# Patient Record
Sex: Male | Born: 1995 | Race: White | Hispanic: No | Marital: Single | Smoking: Never smoker
Health system: Southern US, Community
[De-identification: ages and names within clinical notes are randomized; demographics above are authoritative.]

## PROBLEM LIST (undated history)

## (undated) DIAGNOSIS — F909 Attention-deficit hyperactivity disorder, unspecified type: Secondary | ICD-10-CM

## (undated) DIAGNOSIS — F419 Anxiety disorder, unspecified: Secondary | ICD-10-CM

---

## 2018-11-17 HISTORY — PX: PATELLA FRACTURE SURGERY: SHX735

## 2019-09-10 ENCOUNTER — Emergency Department (HOSPITAL_COMMUNITY)
Admission: EM | Admit: 2019-09-10 | Discharge: 2019-09-10 | Disposition: A | Discharge status: Home / Self Care | Payer: BC Managed Care – PPO | Attending: Emergency Medicine | Admitting: Emergency Medicine

## 2019-09-10 ENCOUNTER — Encounter (HOSPITAL_COMMUNITY): Payer: Self-pay

## 2019-09-10 ENCOUNTER — Other Ambulatory Visit: Payer: Self-pay

## 2019-09-10 ENCOUNTER — Emergency Department (HOSPITAL_COMMUNITY): Payer: BC Managed Care – PPO

## 2019-09-10 DIAGNOSIS — Y92410 Unspecified street and highway as the place of occurrence of the external cause: Secondary | ICD-10-CM | POA: Insufficient documentation

## 2019-09-10 DIAGNOSIS — S52514A Nondisplaced fracture of right radial styloid process, initial encounter for closed fracture: Secondary | ICD-10-CM | POA: Insufficient documentation

## 2019-09-10 DIAGNOSIS — Y93I9 Activity, other involving external motion: Secondary | ICD-10-CM | POA: Insufficient documentation

## 2019-09-10 DIAGNOSIS — S82892A Other fracture of left lower leg, initial encounter for closed fracture: Secondary | ICD-10-CM

## 2019-09-10 DIAGNOSIS — Y999 Unspecified external cause status: Secondary | ICD-10-CM | POA: Diagnosis not present

## 2019-09-10 DIAGNOSIS — S6991XA Unspecified injury of right wrist, hand and finger(s), initial encounter: Secondary | ICD-10-CM | POA: Diagnosis present
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
## 2019-09-10 NOTE — ED Notes (Signed)
ED Provider at bedside. 

## 2019-09-10 NOTE — ED Notes (Signed)
Patient transported to X-ray 
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
## 2022-12-27 ENCOUNTER — Emergency Department (HOSPITAL_COMMUNITY)
Admission: EM | Admit: 2022-12-27 | Discharge: 2022-12-27 | Disposition: A | Discharge status: Home / Self Care | Payer: Self-pay | Attending: Emergency Medicine | Admitting: Emergency Medicine

## 2022-12-27 ENCOUNTER — Emergency Department (HOSPITAL_COMMUNITY): Payer: Self-pay

## 2022-12-27 ENCOUNTER — Emergency Department (HOSPITAL_COMMUNITY): Payer: BC Managed Care – PPO

## 2022-12-27 ENCOUNTER — Encounter (HOSPITAL_COMMUNITY): Payer: Self-pay

## 2022-12-27 DIAGNOSIS — Y92410 Unspecified street and highway as the place of occurrence of the external cause: Secondary | ICD-10-CM | POA: Diagnosis not present

## 2022-12-27 DIAGNOSIS — S0990XA Unspecified injury of head, initial encounter: Secondary | ICD-10-CM | POA: Diagnosis not present

## 2022-12-27 DIAGNOSIS — S90511A Abrasion, right ankle, initial encounter: Secondary | ICD-10-CM | POA: Diagnosis not present

## 2022-12-27 DIAGNOSIS — S99911A Unspecified injury of right ankle, initial encounter: Secondary | ICD-10-CM | POA: Diagnosis present

## 2022-12-27 DIAGNOSIS — T148XXA Other injury of unspecified body region, initial encounter: Secondary | ICD-10-CM

## 2022-12-27 MED ORDER — ONDANSETRON 8 MG PO TBDP
8.0000 mg | ORAL_TABLET | Freq: Once | ORAL | Status: AC
  Administered 2022-12-27: 8 mg via ORAL
  Filled 2022-12-27: qty 1

## 2022-12-27 MED ORDER — CEFAZOLIN SODIUM-DEXTROSE 2-4 GM/100ML-% IV SOLN
2.0000 g | Freq: Once | INTRAVENOUS | Status: AC
  Administered 2022-12-27: 2 g via INTRAVENOUS
  Filled 2022-12-27 (×2): qty 100

## 2022-12-27 MED ORDER — HYDROMORPHONE HCL 1 MG/ML IJ SOLN
1.0000 mg | Freq: Once | INTRAMUSCULAR | Status: AC
  Administered 2022-12-27: 1 mg via INTRAVENOUS
  Filled 2022-12-27: qty 1

## 2022-12-27 MED ORDER — OXYCODONE-ACETAMINOPHEN 5-325 MG PO TABS: 1.0000 | ORAL_TABLET | Freq: Four times a day (QID) | ORAL | 0 refills | Status: DC | PRN

## 2022-12-27 MED ORDER — IBUPROFEN 800 MG PO TABS
800.0000 mg | ORAL_TABLET | Freq: Once | ORAL | Status: AC
  Administered 2022-12-27: 800 mg via ORAL
  Filled 2022-12-27: qty 1

## 2022-12-27 MED ORDER — CEPHALEXIN 500 MG PO CAPS: 500.0000 mg | ORAL_CAPSULE | Freq: Four times a day (QID) | ORAL | 0 refills | Status: AC
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
## 2022-12-29 ENCOUNTER — Ambulatory Visit (INDEPENDENT_AMBULATORY_CARE_PROVIDER_SITE_OTHER): Payer: Self-pay | Admitting: Orthopedic Surgery

## 2022-12-29 ENCOUNTER — Telehealth: Payer: Self-pay

## 2022-12-29 ENCOUNTER — Encounter: Payer: Self-pay | Admitting: Orthopedic Surgery

## 2022-12-29 DIAGNOSIS — S91031A Puncture wound without foreign body, right ankle, initial encounter: Secondary | ICD-10-CM

## 2022-12-29 NOTE — Telephone Encounter (Signed)
I called and lm on vm to advise that Dr. Sharol Given would like to see him in the office today. Advised to call back so that we can set up appt. I will hold this message and try pt again later on today. Right ankle injury.
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
## 2022-12-29 NOTE — Telephone Encounter (Signed)
Appt today at 1:45

## 2022-12-30 ENCOUNTER — Encounter (HOSPITAL_COMMUNITY): Payer: Self-pay | Admitting: Orthopedic Surgery

## 2022-12-30 NOTE — Progress Notes (Signed)
Carlos Ruiz asked me to speak to his wife, Carlos Ruiz, so I did.  Carlos Ruiz denies having any s/s of Covid in her household, also denies any known exposure to Covid.  Carlos Ruiz does not have a PCP. Carlos Ruiz is taking 800 mg of Ibuprofen  for pain, occasionally Percocet or extra strength tylenol. I instructed Carlos Ruiz to stop the Ibuprofen, Carlos Ruiz my take extra strength Tylenol and Percocet; I reminded Carlos Ruiz to not exceed 4000 mg of Tylenol in 24 hours.

## 2022-12-31 ENCOUNTER — Other Ambulatory Visit: Payer: Self-pay

## 2022-12-31 ENCOUNTER — Ambulatory Visit (HOSPITAL_BASED_OUTPATIENT_CLINIC_OR_DEPARTMENT_OTHER): Payer: Self-pay | Admitting: Anesthesiology

## 2022-12-31 ENCOUNTER — Encounter (HOSPITAL_COMMUNITY)
Admission: RE | Disposition: A | Discharge status: Home / Self Care | Payer: Self-pay | Source: Home / Self Care | Attending: Orthopedic Surgery

## 2022-12-31 ENCOUNTER — Encounter (HOSPITAL_COMMUNITY): Payer: Self-pay | Admitting: Orthopedic Surgery

## 2022-12-31 ENCOUNTER — Ambulatory Visit (HOSPITAL_COMMUNITY)
Admission: RE | Admit: 2022-12-31 | Discharge: 2022-12-31 | Disposition: A | Discharge status: Home / Self Care | Payer: Self-pay | Attending: Orthopedic Surgery | Admitting: Orthopedic Surgery

## 2022-12-31 ENCOUNTER — Ambulatory Visit (HOSPITAL_COMMUNITY): Payer: Self-pay | Admitting: Anesthesiology

## 2022-12-31 DIAGNOSIS — M86161 Other acute osteomyelitis, right tibia and fibula: Secondary | ICD-10-CM

## 2022-12-31 DIAGNOSIS — S91031A Puncture wound without foreign body, right ankle, initial encounter: Secondary | ICD-10-CM | POA: Diagnosis present

## 2022-12-31 DIAGNOSIS — F419 Anxiety disorder, unspecified: Secondary | ICD-10-CM

## 2022-12-31 DIAGNOSIS — M869 Osteomyelitis, unspecified: Secondary | ICD-10-CM

## 2022-12-31 DIAGNOSIS — S91001A Unspecified open wound, right ankle, initial encounter: Secondary | ICD-10-CM

## 2022-12-31 HISTORY — DX: Anxiety disorder, unspecified: F41.9

## 2022-12-31 HISTORY — DX: Attention-deficit hyperactivity disorder, unspecified type: F90.9

## 2022-12-31 HISTORY — PX: I & D EXTREMITY: SHX5045

## 2022-12-31 HISTORY — PX: APPLICATION OF WOUND VAC: SHX5189

## 2022-12-31 LAB — CBC
HCT: 38.3 % — ABNORMAL LOW (ref 39.0–52.0)
Hemoglobin: 13.8 g/dL (ref 13.0–17.0)
MCH: 30.3 pg (ref 26.0–34.0)
MCHC: 36 g/dL (ref 30.0–36.0)
MCV: 84.2 fL (ref 80.0–100.0)
Platelets: 219 10*3/uL (ref 150–400)
RBC: 4.55 MIL/uL (ref 4.22–5.81)
RDW: 12 % (ref 11.5–15.5)
WBC: 8.6 10*3/uL (ref 4.0–10.5)
nRBC: 0 % (ref 0.0–0.2)

## 2022-12-31 LAB — SURGICAL PCR SCREEN
MRSA, PCR: NEGATIVE
Staphylococcus aureus: NEGATIVE

## 2022-12-31 SURGERY — IRRIGATION AND DEBRIDEMENT EXTREMITY
Anesthesia: Monitor Anesthesia Care | Laterality: Right

## 2022-12-31 MED ORDER — ONDANSETRON HCL 4 MG/2ML IJ SOLN
INTRAMUSCULAR | Status: DC | PRN
  Administered 2022-12-31: 4 mg via INTRAVENOUS

## 2022-12-31 MED ORDER — ORAL CARE MOUTH RINSE: 15.0000 mL | Freq: Once | OROMUCOSAL | Status: AC

## 2022-12-31 MED ORDER — ROPIVACAINE HCL 5 MG/ML IJ SOLN
INTRAMUSCULAR | Status: DC | PRN
  Administered 2022-12-31: 40 mL via PERINEURAL

## 2022-12-31 MED ORDER — DEXAMETHASONE SODIUM PHOSPHATE 10 MG/ML IJ SOLN
INTRAMUSCULAR | Status: DC | PRN
  Administered 2022-12-31: 10 mg

## 2022-12-31 MED ORDER — FENTANYL CITRATE (PF) 100 MCG/2ML IJ SOLN
INTRAMUSCULAR | Status: AC
  Administered 2022-12-31: 100 ug via INTRAVENOUS
  Filled 2022-12-31: qty 2

## 2022-12-31 MED ORDER — KETOROLAC TROMETHAMINE 30 MG/ML IJ SOLN
INTRAMUSCULAR | Status: DC | PRN
  Administered 2022-12-31: 30 mg via INTRAVENOUS

## 2022-12-31 MED ORDER — ACETAMINOPHEN 500 MG PO TABS: 1000.0000 mg | ORAL_TABLET | Freq: Once | ORAL | Status: DC

## 2022-12-31 MED ORDER — CEFAZOLIN SODIUM-DEXTROSE 2-3 GM-%(50ML) IV SOLR
INTRAVENOUS | Status: DC | PRN
  Administered 2022-12-31: 2 g via INTRAVENOUS

## 2022-12-31 MED ORDER — FENTANYL CITRATE (PF) 100 MCG/2ML IJ SOLN: 100.0000 ug | Freq: Once | INTRAMUSCULAR | Status: AC

## 2022-12-31 MED ORDER — MIDAZOLAM HCL 2 MG/2ML IJ SOLN
INTRAMUSCULAR | Status: AC
  Administered 2022-12-31: 2 mg via INTRAVENOUS
  Filled 2022-12-31: qty 2

## 2022-12-31 MED ORDER — VANCOMYCIN HCL 500 MG IV SOLR
INTRAVENOUS | Status: DC | PRN
  Administered 2022-12-31: 500 mg via TOPICAL

## 2022-12-31 MED ORDER — 0.9 % SODIUM CHLORIDE (POUR BTL) OPTIME
TOPICAL | Status: DC | PRN
  Administered 2022-12-31: 1000 mL

## 2022-12-31 MED ORDER — OXYCODONE-ACETAMINOPHEN 5-325 MG PO TABS: 1.0000 | ORAL_TABLET | ORAL | 0 refills | Status: AC | PRN

## 2022-12-31 MED ORDER — CHLORHEXIDINE GLUCONATE 0.12 % MT SOLN
15.0000 mL | Freq: Once | OROMUCOSAL | Status: AC
  Administered 2022-12-31: 15 mL via OROMUCOSAL
  Filled 2022-12-31: qty 15

## 2022-12-31 MED ORDER — MIDAZOLAM HCL 2 MG/2ML IJ SOLN: 2.0000 mg | Freq: Once | INTRAMUSCULAR | Status: AC

## 2022-12-31 MED ORDER — ACETAMINOPHEN 500 MG PO TABS
1000.0000 mg | ORAL_TABLET | Freq: Once | ORAL | Status: AC
  Administered 2022-12-31: 1000 mg via ORAL
  Filled 2022-12-31: qty 2

## 2022-12-31 MED ORDER — PROPOFOL 10 MG/ML IV BOLUS
INTRAVENOUS | Status: DC | PRN
  Administered 2022-12-31 (×6): 20 mg via INTRAVENOUS
  Administered 2022-12-31: 30 mg via INTRAVENOUS
  Administered 2022-12-31: 10 mg via INTRAVENOUS

## 2022-12-31 MED ORDER — VANCOMYCIN HCL 500 MG IV SOLR
INTRAVENOUS | Status: AC
  Filled 2022-12-31: qty 10

## 2022-12-31 MED ORDER — LACTATED RINGERS IV SOLN: INTRAVENOUS | Status: DC

## 2022-12-31 SURGICAL SUPPLY — 40 items
BAG COUNTER SPONGE SURGICOUNT (BAG) IMPLANT
BAG SPNG CNTER NS LX DISP (BAG)
BLADE LONG MED 31X9 (MISCELLANEOUS) IMPLANT
BLADE SURG 21 STRL SS (BLADE) ×1 IMPLANT
BNDG COHESIVE 6X5 TAN STRL LF (GAUZE/BANDAGES/DRESSINGS) IMPLANT
BNDG GAUZE DERMACEA FLUFF 4 (GAUZE/BANDAGES/DRESSINGS) ×2 IMPLANT
BNDG GZE DERMACEA 4 6PLY (GAUZE/BANDAGES/DRESSINGS)
COVER SURGICAL LIGHT HANDLE (MISCELLANEOUS) ×2 IMPLANT
DRAPE INCISE IOBAN 66X45 STRL (DRAPES) IMPLANT
DRAPE U-SHAPE 47X51 STRL (DRAPES) ×1 IMPLANT
DRESSING VERAFLO CLEANS CC MED (GAUZE/BANDAGES/DRESSINGS) IMPLANT
DRSG ADAPTIC 3X8 NADH LF (GAUZE/BANDAGES/DRESSINGS) ×1 IMPLANT
DRSG VERAFLO CLEANSE CC MED (GAUZE/BANDAGES/DRESSINGS) ×1
DURAPREP 26ML APPLICATOR (WOUND CARE) ×1 IMPLANT
ELECT REM PT RETURN 9FT ADLT (ELECTROSURGICAL)
ELECTRODE REM PT RTRN 9FT ADLT (ELECTROSURGICAL) IMPLANT
GAUZE SPONGE 4X4 12PLY STRL (GAUZE/BANDAGES/DRESSINGS) ×1 IMPLANT
GLOVE BIOGEL PI IND STRL 9 (GLOVE) ×1 IMPLANT
GLOVE SURG ORTHO 9.0 STRL STRW (GLOVE) ×1 IMPLANT
GOWN STRL REUS W/ TWL XL LVL3 (GOWN DISPOSABLE) ×2 IMPLANT
GOWN STRL REUS W/TWL XL LVL3 (GOWN DISPOSABLE) ×2
GRAFT SKIN MARIGEN MICRO 38 (Tissue) IMPLANT
GRAFT SKIN WND OMEGA3 SB 7X10 (Tissue) IMPLANT
HANDPIECE INTERPULSE COAX TIP (DISPOSABLE)
KIT BASIN OR (CUSTOM PROCEDURE TRAY) ×1 IMPLANT
KIT DRSG PREVENA PLUS 7DAY 125 (MISCELLANEOUS) IMPLANT
KIT TURNOVER KIT B (KITS) ×1 IMPLANT
MANIFOLD NEPTUNE II (INSTRUMENTS) ×1 IMPLANT
NS IRRIG 1000ML POUR BTL (IV SOLUTION) ×1 IMPLANT
PACK ORTHO EXTREMITY (CUSTOM PROCEDURE TRAY) ×1 IMPLANT
PAD ARMBOARD 7.5X6 YLW CONV (MISCELLANEOUS) ×2 IMPLANT
PAD NEG PRESSURE SENSATRAC (MISCELLANEOUS) IMPLANT
SET HNDPC FAN SPRY TIP SCT (DISPOSABLE) IMPLANT
STOCKINETTE IMPERVIOUS 9X36 MD (GAUZE/BANDAGES/DRESSINGS) IMPLANT
SUT ETHILON 2 0 PSLX (SUTURE) ×1 IMPLANT
SWAB COLLECTION DEVICE MRSA (MISCELLANEOUS) ×1 IMPLANT
SWAB CULTURE ESWAB REG 1ML (MISCELLANEOUS) IMPLANT
TOWEL GREEN STERILE (TOWEL DISPOSABLE) ×1 IMPLANT
TUBE CONNECTING 12X1/4 (SUCTIONS) ×1 IMPLANT
YANKAUER SUCT BULB TIP NO VENT (SUCTIONS) ×1 IMPLANT

## 2022-12-31 NOTE — Anesthesia Procedure Notes (Signed)
Anesthesia Regional Block: Adductor canal block   Pre-Anesthetic Checklist: , timeout performed,  Correct Patient, Correct Site, Correct Laterality,  Correct Procedure, Correct Position, site marked,  Risks and benefits discussed,  Surgical consent,  Pre-op evaluation,  At surgeon's request and post-op pain management  Laterality: Right  Prep: Maximum Sterile Barrier Precautions used, chloraprep       Needles:  Injection technique: Single-shot  Needle Type: Echogenic Stimulator Needle     Needle Length: 9cm  Needle Gauge: 22     Additional Needles:   Procedures:,,,, ultrasound used (permanent image in chart),,    Narrative:  Start time: 12/31/2022 9:40 AM End time: 12/31/2022 9:45 AM Injection made incrementally with aspirations every 5 mL.  Performed by: Personally  Anesthesiologist: Pervis Hocking, DO  Additional Notes: Monitors applied. No increased pain on injection. No increased resistance to injection. Injection made in 5cc increments. Good needle visualization. Patient tolerated procedure well.
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
## 2022-12-31 NOTE — Anesthesia Postprocedure Evaluation (Signed)
Anesthesia Post Note  Patient: Carlos Ruiz  Procedure(s) Performed: RIGHT ANKLE DEBRIDEMENT AND APPLY TISSUE GRAFT (Right) APPLICATION OF WOUND VAC (Right)     Patient location during evaluation: PACU Anesthesia Type: Regional and MAC Level of consciousness: awake and alert Pain management: pain level controlled Vital Signs Assessment: post-procedure vital signs reviewed and stable Respiratory status: spontaneous breathing, nonlabored ventilation and respiratory function stable Cardiovascular status: blood pressure returned to baseline and stable Postop Assessment: no apparent nausea or vomiting Anesthetic complications: no   No notable events documented.  Last Vitals:  Vitals:   12/31/22 1115 12/31/22 1130  BP: (!) 105/58 106/64  Pulse: (!) 59 71  Resp: 12 15  Temp:  36.4 C  SpO2: 98% 100%    Last Pain:  Vitals:   12/31/22 1115  TempSrc:   PainSc: 0-No pain                 Pervis Hocking

## 2022-12-31 NOTE — Transfer of Care (Signed)
Immediate Anesthesia Transfer of Care Note  Patient: Bjarne Freda  Procedure(s) Performed: RIGHT ANKLE DEBRIDEMENT AND APPLY TISSUE GRAFT (Right) APPLICATION OF WOUND VAC (Right)  Patient Location: PACU  Anesthesia Type:MAC combined with regional for post-op pain  Level of Consciousness: awake and patient cooperative  Airway & Oxygen Therapy: Patient Spontanous Breathing  Post-op Assessment: Report given to RN and Post -op Vital signs reviewed and stable  Post vital signs: Reviewed and stable  Last Vitals:  Vitals Value Taken Time  BP    Temp    Pulse 78 12/31/22 1052  Resp 15 12/31/22 1052  SpO2 100 % 12/31/22 1052  Vitals shown include unvalidated device data.  Last Pain:  Vitals:   12/31/22 0948  TempSrc:   PainSc: 0-No pain      Patients Stated Pain Goal: 0 ([id number])  Complications: No notable events documented.

## 2022-12-31 NOTE — Anesthesia Preprocedure Evaluation (Signed)
Anesthesia Evaluation  Patient identified by MRN, date of birth, ID band Patient awake    Reviewed: Allergy & Precautions, H&P , NPO status , Patient's Chart, lab work & pertinent test results  Airway Mallampati: II  TM Distance: >3 FB Neck ROM: Full    Dental no notable dental hx.    Pulmonary neg pulmonary ROS   Pulmonary exam normal breath sounds clear to auscultation       Cardiovascular negative cardio ROS Normal cardiovascular exam Rhythm:Regular Rate:Normal     Neuro/Psych  PSYCHIATRIC DISORDERS Anxiety     negative neurological ROS     GI/Hepatic negative GI ROS, Neg liver ROS,,,  Endo/Other  negative endocrine ROS    Renal/GU negative Renal ROS  negative genitourinary   Musculoskeletal R ankle traumatic wound   Abdominal   Peds negative pediatric ROS (+)  Hematology negative hematology ROS (+)   Anesthesia Other Findings   Reproductive/Obstetrics negative OB ROS                             Anesthesia Physical Anesthesia Plan  ASA: 1  Anesthesia Plan: Regional and MAC   Post-op Pain Management: Tylenol PO (pre-op)*, Regional block* and Toradol IV (intra-op)*   Induction: Intravenous  PONV Risk Score and Plan: 1 and Ondansetron, Dexamethasone, Midazolam and Treatment may vary due to age or medical condition  Airway Management Planned: Natural Airway and Simple Face Mask  Additional Equipment: None  Intra-op Plan:   Post-operative Plan:   Informed Consent: I have reviewed the patients History and Physical, chart, labs and discussed the procedure including the risks, benefits and alternatives for the proposed anesthesia with the patient or authorized representative who has indicated his/her understanding and acceptance.     Dental advisory given  Plan Discussed with: CRNA  Anesthesia Plan Comments:        Anesthesia Quick Evaluation

## 2022-12-31 NOTE — Progress Notes (Signed)
Orthopedic Tech Progress Note Patient Details:  Carlos Ruiz [birth year] [id number]  Ortho Devices Type of Ortho Device: CAM walker, Crutches Ortho Device/Splint Location: RLE Ortho Device/Splint Interventions: Application, Adjustment   Post Interventions Patient Tolerated: Well  Carlos Ruiz Carlos Ruiz 12/31/2022, 11:51 AM
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
## 2022-12-31 NOTE — Interval H&P Note (Signed)
History and Physical Interval Note:  12/31/2022 7:44 AM  Carlos Ruiz  has presented today for surgery, with the diagnosis of Traumatic Wound Right Ankle.  The various methods of treatment have been discussed with the patient and family. After consideration of risks, benefits and other options for treatment, the patient has consented to  Procedure(s): RIGHT ANKLE DEBRIDEMENT AND APPLY TISSUE GRAFT (Right) as a surgical intervention.  The patient's history has been reviewed, patient examined, no change in status, stable for surgery.  I have reviewed the patient's chart and labs.  Questions were answered to the patient's satisfaction.     Newt Minion

## 2022-12-31 NOTE — Anesthesia Procedure Notes (Signed)
Anesthesia Regional Block: Popliteal block   Pre-Anesthetic Checklist: , timeout performed,  Correct Patient, Correct Site, Correct Laterality,  Correct Procedure, Correct Position, site marked,  Risks and benefits discussed,  Surgical consent,  Pre-op evaluation,  At surgeon's request and post-op pain management  Laterality: Right  Prep: Maximum Sterile Barrier Precautions used, chloraprep       Needles:  Injection technique: Single-shot  Needle Type: Echogenic Stimulator Needle     Needle Length: 9cm  Needle Gauge: 22     Additional Needles:   Procedures:,,,, ultrasound used (permanent image in chart),,    Narrative:  Start time: 12/31/2022 9:35 AM End time: 12/31/2022 9:40 AM Injection made incrementally with aspirations every 5 mL.  Performed by: Personally  Anesthesiologist: Pervis Hocking, DO  Additional Notes: Monitors applied. No increased pain on injection. No increased resistance to injection. Injection made in 5cc increments. Good needle visualization. Patient tolerated procedure well.

## 2023-01-01 ENCOUNTER — Encounter (HOSPITAL_COMMUNITY): Payer: Self-pay | Admitting: Orthopedic Surgery

## 2023-01-05 LAB — AEROBIC/ANAEROBIC CULTURE W GRAM STAIN (SURGICAL/DEEP WOUND): Gram Stain: NONE SEEN

## 2023-01-07 ENCOUNTER — Encounter: Payer: Self-pay | Admitting: Family

## 2023-01-07 ENCOUNTER — Ambulatory Visit (INDEPENDENT_AMBULATORY_CARE_PROVIDER_SITE_OTHER): Payer: Self-pay | Admitting: Family

## 2023-01-07 DIAGNOSIS — S91001A Unspecified open wound, right ankle, initial encounter: Secondary | ICD-10-CM

## 2023-01-07 DIAGNOSIS — Z9889 Other specified postprocedural states: Secondary | ICD-10-CM

## 2023-01-07 NOTE — Progress Notes (Signed)
   Post-Op Visit Note   Patient: Carlos Ruiz           Date of Birth: [birth year]           MRN: [id number] Visit Date: 01/07/2023 PCP: Patient, No Pcp Per  Chief Complaint:  Chief Complaint  Patient presents with   Right Ankle - Routine Post Op    12/31/2022 right ankle debridement and Kerecis graft     HPI:  HPI The patient is a 27 year old gentleman who presents status post right ankle debridement with Kerecis application wound VAC removed today.  The patient wondering if he may begin weightbearing. Ortho Exam On examination of the right dorsal lateral ankle there is a large wound with good uptake of the Kerecis graft.  There is no tunneling no purulence no surrounding erythema or odor  Visit Diagnoses: No diagnosis found.  Plan: Begin daily Dial soap cleansing dry dressings Ace wrap may touchdown weight-bear  Follow-Up Instructions: Return in about 1 week (around 01/14/2023).   Imaging: No results found.  Orders:  No orders of the defined types were placed in this encounter.  No orders of the defined types were placed in this encounter.    PMFS History: Patient Active Problem List   Diagnosis Date Noted   Ankle wound, right, initial encounter 12/31/2022   Pyogenic inflammation of bone ([place]) 12/31/2022   Past Medical History:  Diagnosis Date   ADHD (attention deficit hyperactivity disorder)    undiagnosed   Anxiety     History reviewed. No pertinent family history.  Past Surgical History:  Procedure Laterality Date   APPLICATION OF WOUND VAC Right 12/31/2022   Procedure: APPLICATION OF WOUND VAC;  Surgeon: Newt Minion, MD;  Location: [place];  Service: Orthopedics;  Laterality: Right;   I & D EXTREMITY Right 12/31/2022   Procedure: RIGHT ANKLE DEBRIDEMENT AND APPLY TISSUE GRAFT;  Surgeon: Newt Minion, MD;  Location: [place];  Service: Orthopedics;  Laterality: Right;   PATELLA FRACTURE SURGERY Right 2020   Social History   Occupational History   Not  on file  Tobacco Use   Smoking status: Never   Smokeless tobacco: Never  Vaping Use   Vaping Use: Never used  Substance and Sexual Activity   Alcohol use: Not Currently    Comment: wine or beer ocassional a week   Drug use: Yes    Frequency: 7.0 times per week    Types: Marijuana    Comment: Last time - 12/29/22   Sexual activity: Not on file

## 2023-01-14 ENCOUNTER — Encounter: Payer: Self-pay | Admitting: Family

## 2023-01-14 ENCOUNTER — Ambulatory Visit (INDEPENDENT_AMBULATORY_CARE_PROVIDER_SITE_OTHER): Payer: Self-pay | Admitting: Family

## 2023-01-14 DIAGNOSIS — S91001A Unspecified open wound, right ankle, initial encounter: Secondary | ICD-10-CM

## 2023-01-14 NOTE — Progress Notes (Signed)
   Post-Op Visit Note   Patient: Carlos Ruiz           Date of Birth: 1996-09-22           MRN: [id number] Visit Date: 01/14/2023 PCP: Patient, No Pcp Per  Chief Complaint: No chief complaint on file.   HPI:  HPI The patient is a 27 year old gentleman seen status post right ankle debridement with Kerecis graft placement on February 14 he has been doing dry dressing changes for the last 1 week does report some pinkish-yellow drainage.  No odor Ortho Exam Please see attached image. Overall the ulcer is healing quite well with good uptake of the graft material over the area of the malleolus laterally there is a half dollar sized area filled in with fibrinous tissue this has 3 mm of depth there is no exposed bone.  The remainder of the ulcer is filled with granulation epithelization is showing marked improvement. The malleolar aspect was debrided today and donated Kerecis sheet graft material was applied.  Visit Diagnoses: No diagnosis found.  Plan: He will follow-up in 1 week.  He will leave the Adaptic dressing in place and change the overlying gauze beginning in 3 days.  Follow-Up Instructions: No follow-ups on file.   Imaging: No results found.  Orders:  No orders of the defined types were placed in this encounter.  No orders of the defined types were placed in this encounter.    PMFS History: Patient Active Problem List   Diagnosis Date Noted   Ankle wound, right, initial encounter 12/31/2022   Pyogenic inflammation of bone ([place]) 12/31/2022   Past Medical History:  Diagnosis Date   ADHD (attention deficit hyperactivity disorder)    undiagnosed   Anxiety     History reviewed. No pertinent family history.  Past Surgical History:  Procedure Laterality Date   APPLICATION OF WOUND VAC Right 12/31/2022   Procedure: APPLICATION OF WOUND VAC;  Surgeon: Newt Minion, MD;  Location: [place];  Service: Orthopedics;  Laterality: Right;   I & D EXTREMITY Right 12/31/2022    Procedure: RIGHT ANKLE DEBRIDEMENT AND APPLY TISSUE GRAFT;  Surgeon: Newt Minion, MD;  Location: [place];  Service: Orthopedics;  Laterality: Right;   PATELLA FRACTURE SURGERY Right 2020   Social History   Occupational History   Not on file  Tobacco Use   Smoking status: Never   Smokeless tobacco: Never  Vaping Use   Vaping Use: Never used  Substance and Sexual Activity   Alcohol use: Not Currently    Comment: wine or beer ocassional a week   Drug use: Yes    Frequency: 7.0 times per week    Types: Marijuana    Comment: Last time - 12/29/22   Sexual activity: Not on file

## 2023-01-21 ENCOUNTER — Ambulatory Visit (INDEPENDENT_AMBULATORY_CARE_PROVIDER_SITE_OTHER): Payer: Self-pay | Admitting: Family

## 2023-01-21 ENCOUNTER — Encounter: Payer: Self-pay | Admitting: Family

## 2023-01-21 DIAGNOSIS — S91001A Unspecified open wound, right ankle, initial encounter: Secondary | ICD-10-CM

## 2023-01-21 NOTE — Progress Notes (Signed)
   Post-Op Visit Note   Patient: Carlos Ruiz           Date of Birth: Jul 20, 1996           MRN: [id number] Visit Date: 01/21/2023 PCP: Patient, No Pcp Per  Chief Complaint:  Chief Complaint  Patient presents with   Right Ankle - Routine Post Op    12/31/2022 right debridement and kerecis graft     HPI:  HPI The patient is a 27 year old gentleman who is seen status post right ankle debridement with Kerecis graft placement.  Donated in office graft was placed 1 week ago. Ortho Exam On examination of the right ankle there is no edema of the wound has healed two thirds of the original wound area.  Over the lateral malleolus there continues to be half dollar sized open area with 3 mm of depth there is fibrinous tissue this was debrided with Debrisoft dressing.   There is no drainage surrounding erythema or sign of infection   Visit Diagnoses: No diagnosis found.  Plan: Donated graft material applied over the malleolar area of the wound he will follow-up in the office in 1 week discussed possibility of repeat irrigation debridement and OR  Follow-Up Instructions: No follow-ups on file.   Imaging: No results found.  Orders:  No orders of the defined types were placed in this encounter.  No orders of the defined types were placed in this encounter.    PMFS History: Patient Active Problem List   Diagnosis Date Noted   Ankle wound, right, initial encounter 12/31/2022   Pyogenic inflammation of bone ([place]) 12/31/2022   Past Medical History:  Diagnosis Date   ADHD (attention deficit hyperactivity disorder)    undiagnosed   Anxiety     No family history on file.  Past Surgical History:  Procedure Laterality Date   APPLICATION OF WOUND VAC Right 12/31/2022   Procedure: APPLICATION OF WOUND VAC;  Surgeon: Newt Minion, MD;  Location: [place];  Service: Orthopedics;  Laterality: Right;   I & D EXTREMITY Right 12/31/2022   Procedure: RIGHT ANKLE DEBRIDEMENT AND APPLY TISSUE  GRAFT;  Surgeon: Newt Minion, MD;  Location: [place];  Service: Orthopedics;  Laterality: Right;   PATELLA FRACTURE SURGERY Right 2020   Social History   Occupational History   Not on file  Tobacco Use   Smoking status: Never   Smokeless tobacco: Never  Vaping Use   Vaping Use: Never used  Substance and Sexual Activity   Alcohol use: Not Currently    Comment: wine or beer ocassional a week   Drug use: Yes    Frequency: 7.0 times per week    Types: Marijuana    Comment: Last time - 12/29/22   Sexual activity: Not on file

## 2023-01-23 ENCOUNTER — Encounter: Payer: Self-pay | Admitting: Family

## 2023-01-27 ENCOUNTER — Ambulatory Visit (INDEPENDENT_AMBULATORY_CARE_PROVIDER_SITE_OTHER): Payer: Self-pay | Admitting: Orthopedic Surgery

## 2023-01-27 DIAGNOSIS — S91001A Unspecified open wound, right ankle, initial encounter: Secondary | ICD-10-CM

## 2023-01-27 MED ORDER — SILVER SULFADIAZINE 1 % EX CREA: 1.0000 | TOPICAL_CREAM | Freq: Every day | CUTANEOUS | 3 refills | Status: AC

## 2023-02-09 ENCOUNTER — Encounter: Payer: Self-pay | Admitting: Orthopedic Surgery

## 2023-02-09 NOTE — Progress Notes (Signed)
Office Visit Note   Patient: Carlos Ruiz           Date of Birth: May 11, 1996           MRN: [id number] Visit Date: 01/27/2023              Requested by: No referring provider defined for this encounter. PCP: Patient, No Pcp Per  Chief Complaint  Patient presents with   Right Ankle - Routine Post Op    12/31/2022 right debridement and kerecis graft      HPI: Patient is a 27 year old male who is status post right ankle debridement and application of Kerecis tissue graft on February 14.  Donated Kerecis tissue graft was applied on March 6.  Assessment & Plan: Visit Diagnoses:  1. Ankle wound, right, initial encounter     Plan: Will continue with Dial soap cleansing Silvadene dressing changes a prescription for Silvadene is sent.  Follow-Up Instructions: Return in about 4 weeks (around 02/24/2023).   Ortho Exam  Patient is alert, oriented, no adenopathy, well-dressed, normal affect, normal respiratory effort. Examination the wound has improved granulation tissue decreased size the wound measures 3 x 5 cm.  Imaging: No results found.   Labs: Lab Results  Component Value Date   REPTSTATUS 01/05/2023 FINAL 12/31/2022   GRAMSTAIN NO WBC SEEN NO ORGANISMS SEEN  12/31/2022   CULT  12/31/2022    ABUNDANT ACINETOBACTER CALCOACETICUS/BAUMANNII COMPLEX DR. Shane Badeaux NOTIFIED OF CULTURE GROWTH 01/02/23 BY D. VANHOOK ABUNDANT BACILLUS SPECIES Standardized susceptibility testing for this organism is not available. NO ANAEROBES ISOLATED Performed at [facility], [place] [address], [place], [place] [address]    LABORGA ACINETOBACTER CALCOACETICUS/BAUMANNII COMPLEX 12/31/2022     No results found for: "ALBUMIN", "PREALBUMIN", "CBC"  No results found for: "MG" No results found for: "VD25OH"  No results found for: "PREALBUMIN"    Latest Ref Rng & Units 12/31/2022    8:35 AM  CBC EXTENDED  WBC 4.0 - 10.5 K/uL 8.6   RBC 4.22 - 5.81 MIL/uL 4.55   Hemoglobin 13.0 - 17.0  g/dL 13.8   HCT 39.0 - 52.0 % 38.3   Platelets 150 - 400 K/uL 219      There is no height or weight on file to calculate BMI.  Orders:  No orders of the defined types were placed in this encounter.  Meds ordered this encounter  Medications   silver sulfADIAZINE (SILVADENE) 1 % cream    Sig: Apply 1 Application topically daily. Apply to affected area daily plus dry dressing    Dispense:  400 g    Refill:  3     Procedures: No procedures performed  Clinical Data: No additional findings.  ROS:  All other systems negative, except as noted in the HPI. Review of Systems  Objective: Vital Signs: There were no vitals taken for this visit.  Specialty Comments:  No specialty comments available.  PMFS History: Patient Active Problem List   Diagnosis Date Noted   Ankle wound, right, initial encounter 12/31/2022   Pyogenic inflammation of bone ([place]) 12/31/2022   Past Medical History:  Diagnosis Date   ADHD (attention deficit hyperactivity disorder)    undiagnosed   Anxiety     History reviewed. No pertinent family history.  Past Surgical History:  Procedure Laterality Date   APPLICATION OF WOUND VAC Right 12/31/2022   Procedure: APPLICATION OF WOUND VAC;  Surgeon: Newt Minion, MD;  Location: [place];  Service: Orthopedics;  Laterality: Right;  I & D EXTREMITY Right 12/31/2022   Procedure: RIGHT ANKLE DEBRIDEMENT AND APPLY TISSUE GRAFT;  Surgeon: Newt Minion, MD;  Location: [place];  Service: Orthopedics;  Laterality: Right;   PATELLA FRACTURE SURGERY Right 2020   Social History   Occupational History   Not on file  Tobacco Use   Smoking status: Never   Smokeless tobacco: Never  Vaping Use   Vaping Use: Never used  Substance and Sexual Activity   Alcohol use: Not Currently    Comment: wine or beer ocassional a week   Drug use: Yes    Frequency: 7.0 times per week    Types: Marijuana    Comment: Last time - 12/29/22   Sexual activity: Not on file

## 2023-02-24 ENCOUNTER — Ambulatory Visit (INDEPENDENT_AMBULATORY_CARE_PROVIDER_SITE_OTHER): Payer: Self-pay | Admitting: Orthopedic Surgery

## 2023-02-24 DIAGNOSIS — S91001A Unspecified open wound, right ankle, initial encounter: Secondary | ICD-10-CM

## 2023-02-24 DIAGNOSIS — Z9889 Other specified postprocedural states: Secondary | ICD-10-CM

## 2023-02-24 DIAGNOSIS — S91031A Puncture wound without foreign body, right ankle, initial encounter: Secondary | ICD-10-CM

## 2023-03-08 ENCOUNTER — Encounter: Payer: Self-pay | Admitting: Orthopedic Surgery

## 2023-03-08 NOTE — Progress Notes (Signed)
Office Visit Note   Patient: Carlos Ruiz           Date of Birth: 02-May-1996           MRN: [id number] Visit Date: 02/24/2023              Requested by: No referring provider defined for this encounter. PCP: Patient, No Pcp Per  Chief Complaint  Patient presents with   Right Ankle - Routine Post Op    12/31/2022 right debridement and kerecis graft      HPI: Patient is a 27 year old gentleman who is 2 months status post right ankle debridement and application of Kerecis and donated graft applied March 6.  Assessment & Plan: Visit Diagnoses:  1. Ankle wound, right, initial encounter   2. History of artificial skin graft   3. Penetrating ankle wound, right, initial encounter     Plan: Continue Dial soap cleansing dry dressing changes  Follow-Up Instructions: Return in about 4 weeks (around 03/24/2023).   Ortho Exam  Patient is alert, oriented, no adenopathy, well-dressed, normal affect, normal respiratory effort. Examination there has been significant healing of the large wound.  Currently measures 0.5 x 3 cm.  Imaging: No results found.   Labs: Lab Results  Component Value Date   REPTSTATUS 01/05/2023 FINAL 12/31/2022   GRAMSTAIN NO WBC SEEN NO ORGANISMS SEEN  12/31/2022   CULT  12/31/2022    ABUNDANT ACINETOBACTER CALCOACETICUS/BAUMANNII COMPLEX DR. Nayzeth Altman NOTIFIED OF CULTURE GROWTH 01/02/23 BY D. VANHOOK ABUNDANT BACILLUS SPECIES Standardized susceptibility testing for this organism is not available. NO ANAEROBES ISOLATED Performed at [facility], [address], [place], [place] [address]    LABORGA ACINETOBACTER CALCOACETICUS/BAUMANNII COMPLEX 12/31/2022     No results found for: "ALBUMIN", "PREALBUMIN", "CBC"  No results found for: "MG" No results found for: "VD25OH"  No results found for: "PREALBUMIN"    Latest Ref Rng & Units 12/31/2022    8:35 AM  CBC EXTENDED  WBC 4.0 - 10.5 K/uL 8.6   RBC 4.22 - 5.81 MIL/uL 4.55   Hemoglobin  13.0 - 17.0 g/dL 19.1   HCT 47.8 - 29.5 % 38.3   Platelets 150 - 400 K/uL 219      There is no height or weight on file to calculate BMI.  Orders:  No orders of the defined types were placed in this encounter.  No orders of the defined types were placed in this encounter.    Procedures: No procedures performed  Clinical Data: No additional findings.  ROS:  All other systems negative, except as noted in the HPI. Review of Systems  Objective: Vital Signs: There were no vitals taken for this visit.  Specialty Comments:  No specialty comments available.  PMFS History: Patient Active Problem List   Diagnosis Date Noted   Ankle wound, right, initial encounter 12/31/2022   Pyogenic inflammation of bone 12/31/2022   Past Medical History:  Diagnosis Date   ADHD (attention deficit hyperactivity disorder)    undiagnosed   Anxiety     History reviewed. No pertinent family history.  Past Surgical History:  Procedure Laterality Date   APPLICATION OF WOUND VAC Right 12/31/2022   Procedure: APPLICATION OF WOUND VAC;  Surgeon: Nadara Mustard, MD;  [facility];  Service: Orthopedics;  Laterality: Right;   I & D EXTREMITY Right 12/31/2022   Procedure: RIGHT ANKLE DEBRIDEMENT AND APPLY TISSUE GRAFT;  Surgeon: Nadara Mustard, MD;  [facility];  Service: Orthopedics;  Laterality: Right;   PATELLA FRACTURE SURGERY Right 2020   Social History   Occupational History   Not on file  Tobacco Use   Smoking status: Never   Smokeless tobacco: Never  Vaping Use   Vaping Use: Never used  Substance and Sexual Activity   Alcohol use: Not Currently    Comment: wine or beer ocassional a week   Drug use: Yes    Frequency: 7.0 times per week    Types: Marijuana    Comment: Last time - 12/29/22   Sexual activity: Not on file

## 2023-03-26 ENCOUNTER — Ambulatory Visit (INDEPENDENT_AMBULATORY_CARE_PROVIDER_SITE_OTHER): Payer: Self-pay | Admitting: Orthopedic Surgery

## 2023-03-26 DIAGNOSIS — S91031A Puncture wound without foreign body, right ankle, initial encounter: Secondary | ICD-10-CM

## 2023-03-26 DIAGNOSIS — Z9889 Other specified postprocedural states: Secondary | ICD-10-CM

## 2023-03-26 DIAGNOSIS — S91001A Unspecified open wound, right ankle, initial encounter: Secondary | ICD-10-CM

## 2023-04-06 ENCOUNTER — Encounter: Payer: Self-pay | Admitting: Orthopedic Surgery

## 2023-04-06 NOTE — Progress Notes (Signed)
Office Visit Note   Patient: Carlos Ruiz           Date of Birth: [birth year]           MRN: [id number] Visit Date: 03/26/2023              Requested by: No referring provider defined for this encounter. PCP: Patient, No Pcp Per  Chief Complaint  Patient presents with   Right Ankle - Routine Post Op    12/31/2022 right debridement and kerecis graft      HPI: Patient is a 27 year old gentleman status posttraumatic wound down to bone right lateral ankle.  Patient has undergone tissue reinforcement with Kerecis graft.  Assessment & Plan: Visit Diagnoses:  1. Ankle wound, right, initial encounter   2. History of artificial skin graft   3. Penetrating ankle wound, right, initial encounter     Plan: Patient will work on range of motion and strengthening.  He may use a moisturizing lotion for scar massage.  Follow-Up Instructions: No follow-ups on file.   Ortho Exam  Patient is alert, oriented, no adenopathy, well-dressed, normal affect, normal respiratory effort. Examination the traumatic wound dorsal lateral aspect of the right ankle is completely healed.  Patient has excellent range of motion of the ankle he denies any stiffness the skin is soft and mobile.  Patient states he feels well has no concerns.  Imaging: No results found.   Labs: Lab Results  Component Value Date   REPTSTATUS 01/05/2023 FINAL 12/31/2022   GRAMSTAIN NO WBC SEEN NO ORGANISMS SEEN  12/31/2022   CULT  12/31/2022    ABUNDANT ACINETOBACTER CALCOACETICUS/BAUMANNII COMPLEX DR. Albi Rappaport NOTIFIED OF CULTURE GROWTH 01/02/23 BY D. VANHOOK ABUNDANT BACILLUS SPECIES Standardized susceptibility testing for this organism is not available. NO ANAEROBES ISOLATED Performed at [facility], [address], [place], [place] [address]    LABORGA ACINETOBACTER CALCOACETICUS/BAUMANNII COMPLEX 12/31/2022     No results found for: "ALBUMIN", "PREALBUMIN", "CBC"  No results found for: "MG" No results  found for: "VD25OH"  No results found for: "PREALBUMIN"    Latest Ref Rng & Units 12/31/2022    8:35 AM  CBC EXTENDED  WBC 4.0 - 10.5 K/uL 8.6   RBC 4.22 - 5.81 MIL/uL 4.55   Hemoglobin 13.0 - 17.0 g/dL 19.1   HCT 47.8 - 29.5 % 38.3   Platelets 150 - 400 K/uL 219      There is no height or weight on file to calculate BMI.  Orders:  No orders of the defined types were placed in this encounter.  No orders of the defined types were placed in this encounter.    Procedures: No procedures performed  Clinical Data: No additional findings.  ROS:  All other systems negative, except as noted in the HPI. Review of Systems  Objective: Vital Signs: There were no vitals taken for this visit.  Specialty Comments:  No specialty comments available.  PMFS History: Patient Active Problem List   Diagnosis Date Noted   Ankle wound, right, initial encounter 12/31/2022   Pyogenic inflammation of bone (HCC) 12/31/2022   Past Medical History:  Diagnosis Date   ADHD (attention deficit hyperactivity disorder)    undiagnosed   Anxiety     History reviewed. No pertinent family history.  Past Surgical History:  Procedure Laterality Date   APPLICATION OF WOUND VAC Right 12/31/2022   Procedure: APPLICATION OF WOUND VAC;  Surgeon: Nadara Mustard, MD;  Location: [facility] OR;  Service: Orthopedics;  Laterality: Right;   I & D EXTREMITY Right 12/31/2022   Procedure: RIGHT ANKLE DEBRIDEMENT AND APPLY TISSUE GRAFT;  Surgeon: Nadara Mustard, MD;  Location: [facility] OR;  Service: Orthopedics;  Laterality: Right;   PATELLA FRACTURE SURGERY Right 2020   Social History   Occupational History   Not on file  Tobacco Use   Smoking status: Never   Smokeless tobacco: Never  Vaping Use   Vaping Use: Never used  Substance and Sexual Activity   Alcohol use: Not Currently    Comment: wine or beer ocassional a week   Drug use: Yes    Frequency: 7.0 times per week    Types: Marijuana    Comment: Last time -  12/29/22   Sexual activity: Not on file
